# Patient Record
Sex: Female | Born: 1975 | Race: White | Hispanic: No | Marital: Married | Smoking: Never smoker
Health system: Southern US, Community
[De-identification: ages and names within clinical notes are randomized; demographics above are authoritative.]

## PROBLEM LIST (undated history)

## (undated) DIAGNOSIS — E119 Type 2 diabetes mellitus without complications: Secondary | ICD-10-CM

## (undated) HISTORY — PX: KNEE SURGERY: SHX244

---

## 2014-07-04 ENCOUNTER — Ambulatory Visit: Payer: Self-pay | Admitting: Orthopedic Surgery

## 2015-04-19 NOTE — Op Note (Signed)
PATIENT NAME:  Kimberly Blair, Kimberly Blair MR#:  [id number] DATE OF BIRTH:  1976-02-20  DATE OF PROCEDURE:  07/04/2014  PREOPERATIVE DIAGNOSIS: Medial meniscus tear and osteoarthritis, right knee.   POSTOPERATIVE DIAGNOSIS:  Medial meniscus tear and osteoarthritis, right knee.   PROCEDURE: Partial medial meniscectomy and medial femoral chondroplasty, right knee.   ANESTHESIA: General.   SURGEON: Kennedy BuckerMichael Samaiya Awadallah, MD   DESCRIPTION OF PROCEDURE: The patient was brought to the operating room and after adequate anesthesia was obtained, the right leg was prepped and draped in the usual sterile fashion with a tourniquet applied to the upper thigh along with the arthroscopic legholder. After patient identification and timeout procedures were completed, an inferolateral portal was made and the arthroscope was introduced. Initial inspection of the knee revealed some central articular cartilage loss on the patella with some fibrillation of the trochlear cartilage. The patella appeared to track well. The suprapatellar pouch was essentially normal except for some small bits of loose articular cartilage floating around within the knee, nothing large enough to count as a loose body. Coming around medially, there was no plica band. An inferomedial portal was made and a probe introduced. There was an anterior detached tear of the medial meniscus that flipped up into the notch and also could be brought into the joint as significant loose fragment. This fragment of meniscus was subsequently excised with the use of an ArthroCare wand. With regard to the medial femoral condyle, there was fissuring and loose flaps of articular cartilage over approximately 50% of the weight bearing surface with the knee in extension and mid flexion. There were some small areas of exposed bone. The ACL was intact and lateral compartment was essentially normal. The gutters were free of any loose bodies. The ArthroCare wand was used to address the anterior horn  medial meniscus tear and a Paragon wand was then used to smooth the articular surface and remove these loose flaps of articular cartilage. Following this, the knee was thoroughly irrigated until clear. All instrumentation was withdrawn. The wound was closed with simple interrupted 4-0 nylon, 20 mL 0.5% Sensorcaine infiltrated for postoperative analgesia. Xeroform, 4 x 4's, Webril and Ace wrap applied. The patient was sent to the recovery room in stable condition.   ESTIMATED BLOOD LOSS: Minimal.   COMPLICATIONS: None.   SPECIMEN: None.    ____________________________ Leitha SchullerMichael J. Marta Bouie, MD mjm:dd D: 07/04/2014 22:16:10 ET T: 07/05/2014 03:04:38 ET JOB#: [id number]  cc: Leitha SchullerMichael J. Elleanor Guyett, MD, <Dictator> Leitha SchullerMICHAEL J Freman Lapage MD ELECTRONICALLY SIGNED 07/05/2014 7:51

## 2018-07-24 ENCOUNTER — Emergency Department
Admission: EM | Admit: 2018-07-24 | Discharge: 2018-07-24 | Disposition: A | Discharge status: Home / Self Care | Payer: Self-pay | Attending: Emergency Medicine | Admitting: Emergency Medicine

## 2018-07-24 ENCOUNTER — Encounter: Payer: Self-pay | Admitting: Emergency Medicine

## 2018-07-24 ENCOUNTER — Other Ambulatory Visit: Payer: Self-pay

## 2018-07-24 ENCOUNTER — Emergency Department: Payer: Self-pay

## 2018-07-24 DIAGNOSIS — R739 Hyperglycemia, unspecified: Secondary | ICD-10-CM

## 2018-07-24 DIAGNOSIS — N12 Tubulo-interstitial nephritis, not specified as acute or chronic: Secondary | ICD-10-CM

## 2018-07-24 DIAGNOSIS — N10 Acute tubulo-interstitial nephritis: Secondary | ICD-10-CM | POA: Insufficient documentation

## 2018-07-24 DIAGNOSIS — R109 Unspecified abdominal pain: Secondary | ICD-10-CM

## 2018-07-24 DIAGNOSIS — R509 Fever, unspecified: Secondary | ICD-10-CM

## 2018-07-24 DIAGNOSIS — Z79899 Other long term (current) drug therapy: Secondary | ICD-10-CM | POA: Insufficient documentation

## 2018-07-24 DIAGNOSIS — E1165 Type 2 diabetes mellitus with hyperglycemia: Secondary | ICD-10-CM | POA: Insufficient documentation

## 2018-07-24 HISTORY — DX: Type 2 diabetes mellitus without complications: E11.9

## 2018-07-24 LAB — COMPREHENSIVE METABOLIC PANEL
ALK PHOS: 105 U/L (ref 38–126)
ALT: 67 U/L — AB (ref 0–44)
AST: 88 U/L — ABNORMAL HIGH (ref 15–41)
Albumin: 3.3 g/dL — ABNORMAL LOW (ref 3.5–5.0)
Anion gap: 13 (ref 5–15)
BUN: 19 mg/dL (ref 6–20)
CALCIUM: 8.4 mg/dL — AB (ref 8.9–10.3)
CHLORIDE: 93 mmol/L — AB (ref 98–111)
CO2: 22 mmol/L (ref 22–32)
Creatinine, Ser: 1.07 mg/dL — ABNORMAL HIGH (ref 0.44–1.00)
GFR calc Af Amer: >60 mL/min (ref >60)
GFR calc non Af Amer: >60 mL/min (ref >60)
GLUCOSE: 394 mg/dL — AB (ref 70–99)
Potassium: 4.4 mmol/L (ref 3.5–5.1)
Sodium: 128 mmol/L — ABNORMAL LOW (ref 135–145)
Total Bilirubin: 0.5 mg/dL (ref 0.3–1.2)
Total Protein: 7.2 g/dL (ref 6.5–8.1)

## 2018-07-24 LAB — GLUCOSE, CAPILLARY: GLUCOSE-CAPILLARY: 273 mg/dL — AB (ref 70–99)

## 2018-07-24 LAB — URINALYSIS, COMPLETE (UACMP) WITH MICROSCOPIC
BILIRUBIN URINE: NEGATIVE
Glucose, UA: >=500 mg/dL — AB
Ketones, ur: 5 mg/dL — AB
Nitrite: POSITIVE — AB
Protein, ur: 30 mg/dL — AB
Specific Gravity, Urine: 1.013 (ref 1.005–1.030)
WBC, UA: >50 WBC/hpf — ABNORMAL HIGH (ref 0–5)
pH: 6 (ref 5.0–8.0)

## 2018-07-24 LAB — CBC WITH DIFFERENTIAL/PLATELET
Basophils Absolute: 0 10*3/uL (ref 0–0.1)
Basophils Relative: 0 %
EOS ABS: 0 10*3/uL (ref 0–0.7)
Eosinophils Relative: 0 %
HEMATOCRIT: 34.5 % — AB (ref 35.0–47.0)
Hemoglobin: 12 g/dL (ref 12.0–16.0)
LYMPHS ABS: 0.5 10*3/uL — AB (ref 1.0–3.6)
LYMPHS PCT: 7 %
MCH: 30.9 pg (ref 26.0–34.0)
MCHC: 34.7 g/dL (ref 32.0–36.0)
MCV: 88.8 fL (ref 80.0–100.0)
MONOS PCT: 6 %
Monocytes Absolute: 0.4 10*3/uL (ref 0.2–0.9)
Neutro Abs: 6.3 10*3/uL (ref 1.4–6.5)
Neutrophils Relative %: 87 %
Platelets: 251 10*3/uL (ref 150–440)
RBC: 3.88 MIL/uL (ref 3.80–5.20)
RDW: 12.2 % (ref 11.5–14.5)
WBC: 7.3 10*3/uL (ref 3.6–11.0)

## 2018-07-24 LAB — LACTIC ACID, PLASMA: Lactic Acid, Venous: 1.7 mmol/L (ref 0.5–1.9)

## 2018-07-24 LAB — POCT PREGNANCY, URINE: PREG TEST UR: NEGATIVE

## 2018-07-24 LAB — LIPASE, BLOOD: LIPASE: 19 U/L (ref 11–51)

## 2018-07-24 MED ORDER — SODIUM CHLORIDE 0.9 % IV BOLUS
1000.0000 mL | Freq: Once | INTRAVENOUS | Status: AC
  Administered 2018-07-24: 1000 mL via INTRAVENOUS

## 2018-07-24 MED ORDER — ACETAMINOPHEN 500 MG PO TABS
1000.0000 mg | ORAL_TABLET | Freq: Once | ORAL | Status: AC
  Administered 2018-07-24: 1000 mg via ORAL
  Filled 2018-07-24: qty 2

## 2018-07-24 MED ORDER — ONDANSETRON 4 MG PO TBDP: 4.0000 mg | ORAL_TABLET | Freq: Three times a day (TID) | ORAL | 0 refills | Status: AC | PRN

## 2018-07-24 MED ORDER — SODIUM CHLORIDE 0.9 % IV SOLN
2.0000 g | Freq: Once | INTRAVENOUS | Status: AC
  Administered 2018-07-24: 2 g via INTRAVENOUS
  Filled 2018-07-24: qty 20

## 2018-07-24 MED ORDER — CEPHALEXIN 500 MG PO CAPS: 500.0000 mg | ORAL_CAPSULE | Freq: Three times a day (TID) | ORAL | 0 refills | Status: AC

## 2018-07-24 NOTE — ED Triage Notes (Signed)
Pt reports lower left back pain since Thursday with difficulty voiding; pt says she has pressure at the end of her stream; now with lower left quadrant pain and feeling hot; pt febrile in triage,100.8; temp never checked at home; ibuprofen taken at 4pm and Alleve at 7pm; pt is diabetic; blood sugar at home 358 at 1am; pt took 12 units Levemir;

## 2018-07-24 NOTE — ED Provider Notes (Signed)
[facility] Emergency Department Provider Note   ____________________________________________   First MD Initiated Contact with Patient 07/24/18 [phone]     (approximate)  I have reviewed the triage vital signs and the nursing notes.   HISTORY  Chief Complaint Code Sepsis    HPI Kimberly Blair is a 42 y.o. female who presents to the ED from home with a chief complaint of left flank pain and urinary problems.  Patient reports 1 week ago she was experiencing dysuria.  Started to drink cranberry juice.  Complains of left flank pain x4 days with difficulty voiding.  Reports urgency and hesitancy.  Today with left lower quadrant abdominal pain and having chills.  Took ibuprofen at 4 PM and Aleve at 7 PM.  Patient is an insulin-dependent diabetic and has noticed her blood sugars at home in the 300 range; baseline 120s.  Denies associated headache, neck pain, chest pain, shortness of breath, cough, nausea, vomiting, diarrhea.  Denies recent travel or trauma.   Past Medical History:  Diagnosis Date  . Diabetes (HCC)     There are no active problems to display for this patient.   Past Surgical History:  Procedure Laterality Date  . KNEE SURGERY Right     Prior to Admission medications   Medication Sig Start Date End Date Taking? Authorizing Provider  glipiZIDE (GLUCOTROL) 5 MG tablet Take by mouth 2 (two) times daily before a meal.   Yes [provider]  insulin detemir (LEVEMIR) 100 UNIT/ML injection Inject into the skin as needed.   Yes [provider]  lisinopril (PRINIVIL,ZESTRIL) 10 MG tablet Take 10 mg by mouth daily.   Yes [provider]  metFORMIN (GLUCOPHAGE) 1000 MG tablet Take 1,000 mg by mouth 2 (two) times daily with a meal.   Yes [provider]  cephALEXin (KEFLEX) 500 MG capsule Take 1 capsule (500 mg total) by mouth 3 (three) times daily. 07/24/18   Irean Hong, MD  ondansetron (ZOFRAN ODT) 4 MG  disintegrating tablet Take 1 tablet (4 mg total) by mouth every 8 (eight) hours as needed for nausea or vomiting. 07/24/18   Irean Hong, MD    Allergies Codeine  History reviewed. No pertinent family history.  Social History Social History   Tobacco Use  . Smoking status: Never Smoker  . Smokeless tobacco: Never Used  Substance Use Topics  . Alcohol use: Yes  . Drug use: Never    Review of Systems  Constitutional: Positive for fever/chills Eyes: No visual changes. ENT: No sore throat. Cardiovascular: Denies chest pain. Respiratory: Denies shortness of breath. Gastrointestinal: Positive for left flank and left lower quadrant abdominal pain.  No nausea, no vomiting.  No diarrhea.  No constipation. Genitourinary: Positive for dysuria. Musculoskeletal: Negative for back pain. Skin: Negative for rash. Neurological: Negative for headaches, focal weakness or numbness.   ____________________________________________   PHYSICAL EXAM:  VITAL SIGNS: ED Triage Vitals  Enc Vitals Group     BP 07/24/18 0238 (!) 105/45     Pulse Rate 07/24/18 0238 (!) 146     Resp 07/24/18 0238 17     Temp 07/24/18 0238 (!) 100.8 F (38.2 C)     Temp Source 07/24/18 0238 Oral     SpO2 07/24/18 0238 97 %     Weight 07/24/18 0239 146 lb (66.2 kg)     Height 07/24/18 0239 5\' 4"  (1.626 m)     Head Circumference --      Peak Flow --  Pain Score 07/24/18 0239 1     Pain Loc --      Pain Edu? --      Excl. in GC? --     Constitutional: Alert and oriented.  Anxious appearing and in mild acute distress. Eyes: Conjunctivae are normal. PERRL. EOMI. Head: Atraumatic. Nose: No congestion/rhinnorhea. Mouth/Throat: Mucous membranes are moist.  Oropharynx non-erythematous. Neck: No stridor.   Cardiovascular: Tachycardic rate, regular rhythm. Grossly normal heart sounds.  Good peripheral circulation. Respiratory: Normal respiratory effort.  No retractions. Lungs CTAB. Gastrointestinal: Soft and  mildly tender to palpation left lower quadrant without rebound or guarding. No distention. No abdominal bruits.  Mild left CVA tenderness. Musculoskeletal: No lower extremity tenderness nor edema.  No joint effusions. Neurologic:  Normal speech and language. No gross focal neurologic deficits are appreciated. No gait instability. Skin:  Skin is warm, dry and intact. No rash noted. Psychiatric: Mood and affect are normal. Speech and behavior are normal.  ____________________________________________   LABS (all labs ordered are listed, but only abnormal results are displayed)  Labs Reviewed  COMPREHENSIVE METABOLIC PANEL - Abnormal; Notable for the following components:      Result Value   Sodium 128 (*)    Chloride 93 (*)    Glucose, Bld 394 (*)    Creatinine, Ser 1.07 (*)    Calcium 8.4 (*)    Albumin 3.3 (*)    AST 88 (*)    ALT 67 (*)    All other components within normal limits  CBC WITH DIFFERENTIAL/PLATELET - Abnormal; Notable for the following components:   HCT 34.5 (*)    Lymphs Abs 0.5 (*)    All other components within normal limits  URINALYSIS, COMPLETE (UACMP) WITH MICROSCOPIC - Abnormal; Notable for the following components:   Color, Urine YELLOW (*)    APPearance HAZY (*)    Glucose, UA >=500 (*)    Hgb urine dipstick MODERATE (*)    Ketones, ur 5 (*)    Protein, ur 30 (*)    Nitrite POSITIVE (*)    Leukocytes, UA SMALL (*)    WBC, UA >50 (*)    Bacteria, UA RARE (*)    Non Squamous Epithelial 0-5 (*)    All other components within normal limits  GLUCOSE, CAPILLARY - Abnormal; Notable for the following components:   Glucose-Capillary 273 (*)    All other components within normal limits  CULTURE, BLOOD (ROUTINE X 2)  CULTURE, BLOOD (ROUTINE X 2)  LACTIC ACID, PLASMA  LIPASE, BLOOD  POC URINE PREG, ED  POCT PREGNANCY, URINE   ____________________________________________  EKG  None ____________________________________________  RADIOLOGY  ED MD  interpretation: No stones, pyelonephritis, liver density, pancreatic head haziness  Official radiology report(s): Ct Renal Stone Study  Result Date: 07/24/2018 CLINICAL DATA:  Left flank pain and fever. EXAM: CT ABDOMEN AND PELVIS WITHOUT CONTRAST TECHNIQUE: Multidetector CT imaging of the abdomen and pelvis was performed following the standard protocol without IV contrast. COMPARISON:  None. FINDINGS: Lower chest: Minimal hypoventilatory atelectasis. Hepatobiliary: Questionable peripheral low-density lesion in the right lobe of the liver, incompletely characterized without contrast. Gallbladder physiologically distended, no calcified stone. No biliary dilatation. Pancreas: Pancreas is not well-defined, without definite pancreatic tissue visualized. There is soft tissue edema in the expected location of the pancreatic head with questionable minimal pancreatic tissue. Spleen: Normal in size without focal abnormality. Adrenals/Urinary Tract: No adrenal nodule. Moderate left perinephric edema with prominence of the ureter and renal collecting system. No obstructing stone.  No right hydronephrosis or perinephric edema. Urinary bladder is partially distended, equivocal mild wall thickening about the dome. Stomach/Bowel: Stomach is within normal limits. Appendix appears normal. No evidence of bowel wall thickening, distention, or inflammatory changes. Vascular/Lymphatic: Normal caliber abdominal aorta. Minimal bi-iliac atherosclerosis. Small retroperitoneal nodes. No enlarged abdominal or pelvic lymph nodes allowing for limitations secondary to lack of IV contrast. Reproductive: Retroverted uterus which appears slightly bulbous, possible underlying fibroids. No evidence of adnexal mass. Other: No free air, free fluid, or intra-abdominal fluid collection. Musculoskeletal: There are no acute or suspicious osseous abnormalities. IMPRESSION: 1. Left perinephric edema without urolithiasis, suspicious for pyelonephritis.  Equivocal bladder wall thickening which may represent cystitis. 2. Questionable low-density lesion in the right lobe of the liver, incompletely assessed on noncontrast exam. Recommend hepatic protocol MRI on a nonemergent basis for characterization. 3. Pancreatic tissue is not well visualized, however there is edema in the region of the pancreatic head. Findings raise concern for pancreatitis, recommend correlation with pancreatic enzymes. Pancreatic parenchyma can also be assessed on hepatic MRI. Electronically Signed   By: Rubye Oaks M.D.   On: 07/24/2018 05:22    ____________________________________________   PROCEDURES  Procedure(s) performed: None  Procedures  Critical Care performed: Yes, see critical care note(s)   CRITICAL CARE Performed by: Irean Hong   Total critical care time: 45 minutes  Critical care time was exclusive of separately billable procedures and treating other patients.  Critical care was necessary to treat or prevent imminent or life-threatening deterioration.  Critical care was time spent personally by me on the following activities: development of treatment plan with patient and/or surrogate as well as nursing, discussions with consultants, evaluation of patient's response to treatment, examination of patient, obtaining history from patient or surrogate, ordering and performing treatments and interventions, ordering and review of laboratory studies, ordering and review of radiographic studies, pulse oximetry and re-evaluation of patient's condition.  ____________________________________________   INITIAL IMPRESSION / ASSESSMENT AND PLAN / ED COURSE  As part of my medical decision making, I reviewed the following data within the electronic MEDICAL RECORD NUMBER Nursing notes reviewed and incorporated, Labs reviewed, Old chart reviewed, Radiograph reviewed  and Notes from prior ED visits   42 year old female who presents with left flank, left lower  quadrant abdominal pain and difficulty voiding. Differential diagnosis includes, but is not limited to, ovarian cyst, ovarian torsion, acute appendicitis, diverticulitis, urinary tract infection/pyelonephritis, endometriosis, bowel obstruction, colitis, renal colic, gastroenteritis, hernia, fibroids, endometriosis, pregnancy related pain including ectopic pregnancy, etc.  Will check blood cultures, lactate, basic lab work.  Initiate IV fluid resuscitation.  Tylenol for fever.  Will obtain CT renal colic protocol and reassess.  Clinical Course as of Jul 24 732  Mon Jul 24, 2018  0407 Heart rate down to 111.  Patient resting in no acute distress, awaiting CT scan.  Updated her of lab results.  Will administer second liter IV fluid and recheck blood sugar.   [JS]  [id number] Patient resting in no acute distress, awaiting results of CT scan.  2 L IV fluids infused.  Patient will try to provide urine specimen.  Will recheck blood sugar at this time.  Blood pressure 86/61 without altered mentation.  Temperature down to 98.9 F.   [JS]  [id number] Updated patient on CT and urinalysis results.  Lipase is normal.  Preliminary blood cultures negative.  Will hang third liter IV normal saline.  2 g IV Rocephin for pyelonephritis and reassess.  Repeat blood sugar after 2  L normal saline is 273.   [JS]  0733 IV antibiotics finished.  Patient is feeling significantly better.  Will discharge home on Keflex.  ODT Zofran as needed for nausea.  She will follow-up closely with her PCP for recheck as well as to discuss hepatic MRI.  Strict return precautions given.  Patient verbalizes understanding agrees with plan of care.   [JS]    Clinical Course User Index [JS] Irean Hong, MD     ____________________________________________   FINAL CLINICAL IMPRESSION(S) / ED DIAGNOSES  Final diagnoses:  Fever, unspecified fever cause  Acute left flank pain  Pyelonephritis  Hyperglycemia     ED Discharge Orders         Ordered    cephALEXin (KEFLEX) 500 MG capsule  3 times daily     07/24/18 0634    ondansetron (ZOFRAN ODT) 4 MG disintegrating tablet  Every 8 hours PRN     07/24/18 6213       Note:  This document was prepared using Dragon voice recognition software and may include unintentional dictation errors.    Irean Hong, MD 07/24/18 [phone]

## 2018-07-24 NOTE — Discharge Instructions (Signed)
1.  Alternate Tylenol and ibuprofen every 4 hours as needed for fever greater than 100.4 F. 2.  Take antibiotic as prescribed (Keflex 500 mg 3 times daily x7 days). 3.  You may take Zofran as needed for nausea. 4.  There may be a lesion on your liver but this was not clearly seen on your CT scan.  Please speak with your doctor about getting a liver MRI to further evaluate this. 5.  Return to the ER for worsening symptoms, persistent vomiting, difficulty breathing, or other concerns.

## 2018-07-24 NOTE — ED Notes (Signed)
ED Provider at bedside. 

## 2018-07-24 NOTE — ED Notes (Signed)
MD informed of patient's BP 

## 2018-07-29 LAB — CULTURE, BLOOD (ROUTINE X 2)
CULTURE: NO GROWTH
Culture: NO GROWTH
Special Requests: ADEQUATE

## 2019-06-24 IMAGING — CT CT RENAL STONE PROTOCOL
3 of 4 series · 9 of 46 positions shown, 14 images · non-contrast
Comparison: None.

CLINICAL DATA: Left flank pain and fever.

EXAM:
CT ABDOMEN AND PELVIS WITHOUT CONTRAST
TECHNIQUE: Multidetector CT imaging of the abdomen and pelvis was performed
following the standard protocol without IV contrast.

[Series 4: lung bases · axial · 0.64mm/px · z∈[-828,-743]mm · 5 of 27 slices shown, 10 images]
[im 5/27  soft-tissue]
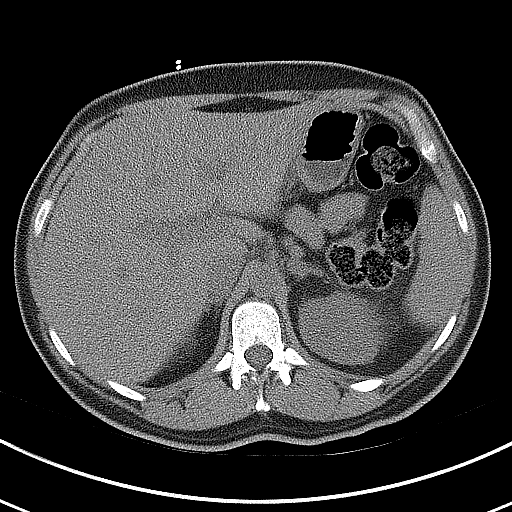
[im 5/27  bone]
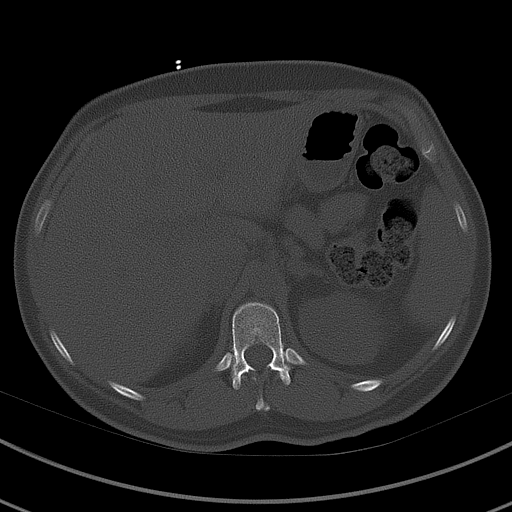
[im 9/27  soft-tissue]
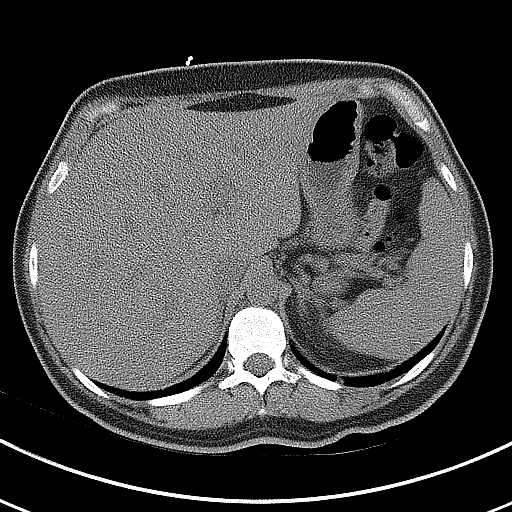
[im 9/27  lung]
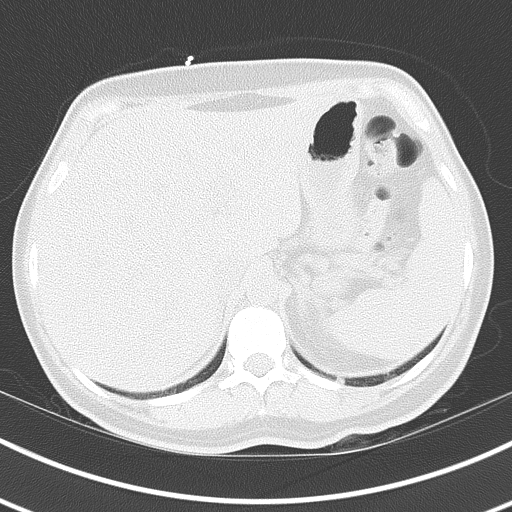
[im 14/27  soft-tissue]
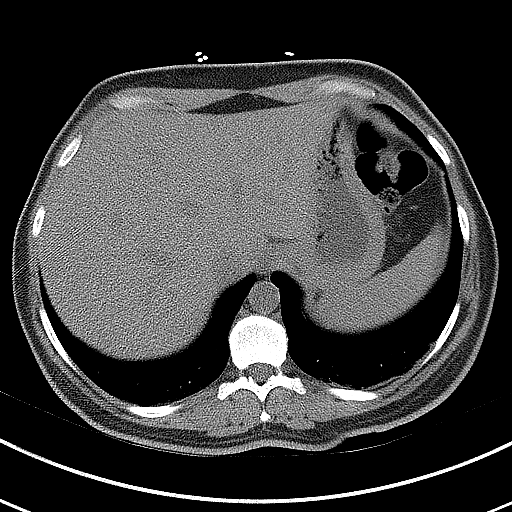
[im 14/27  lung]
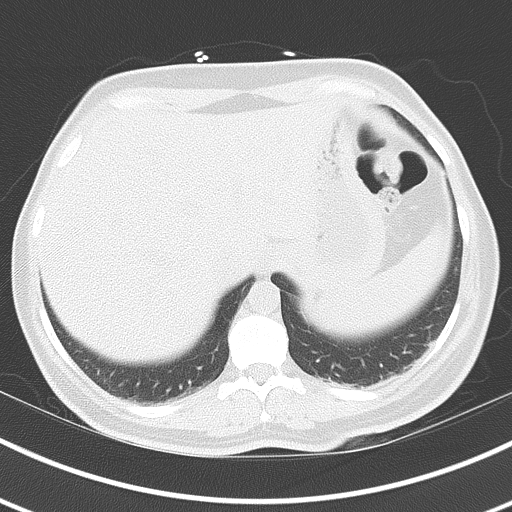
[im 18/27  soft-tissue]
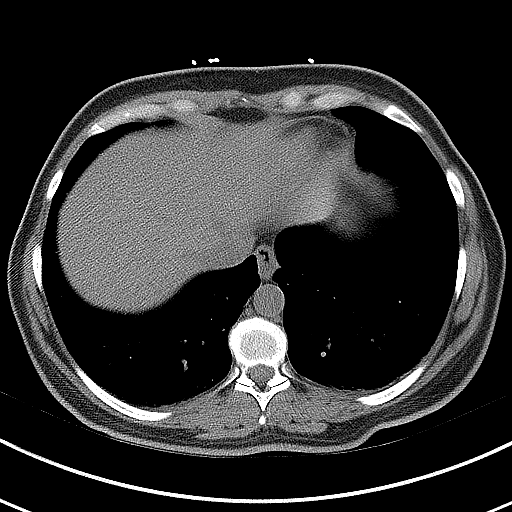
[im 18/27  lung]
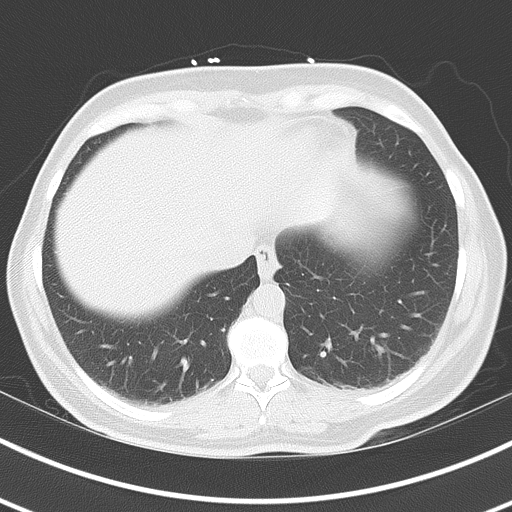
[im 22/27  soft-tissue]
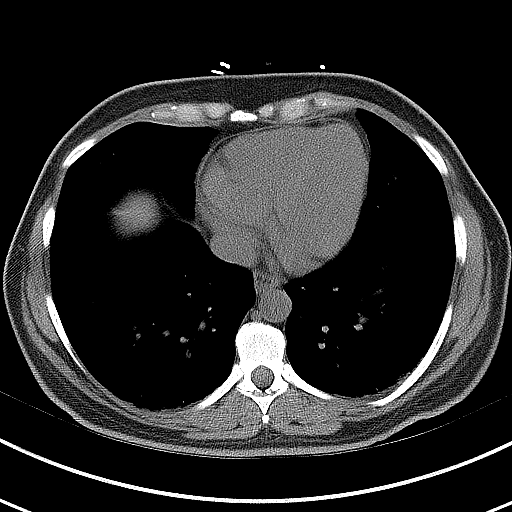
[im 22/27  lung]
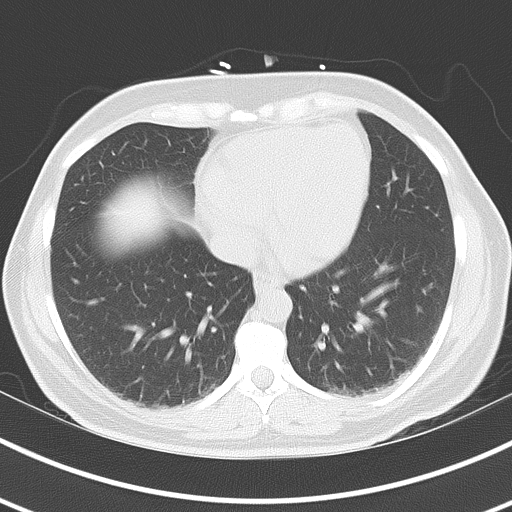

[Series 5: coronal · coronal · 0.66mm/px · 3 of 123 slices shown]
[im 41/123  soft-tissue]
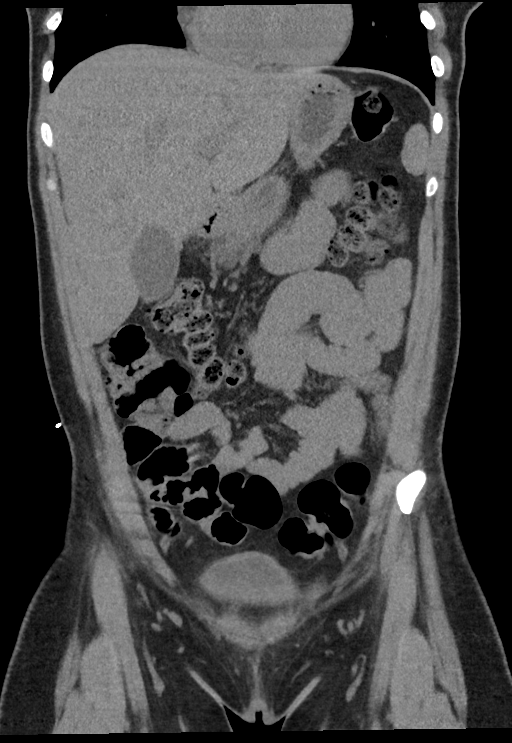
[im 55/123  soft-tissue]
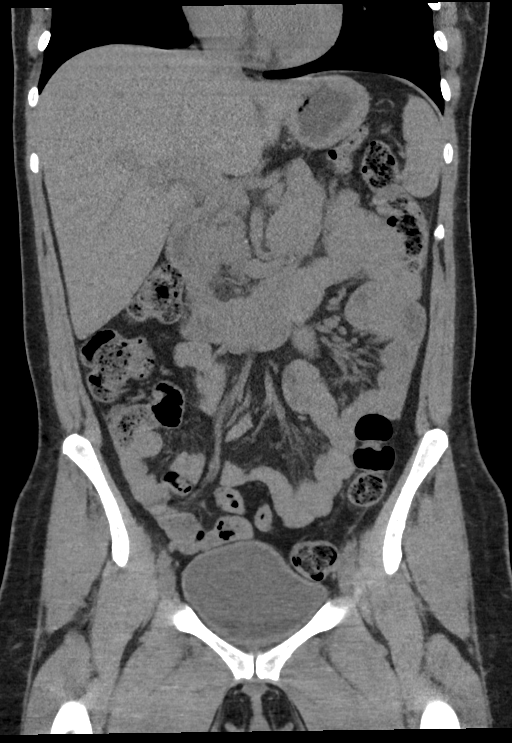
[im 68/123  soft-tissue]
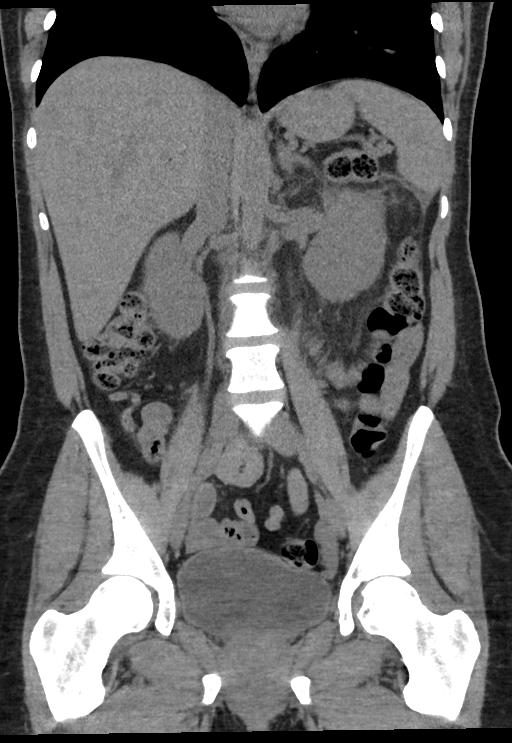

[Series 6: sagittal · sagittal · 0.48mm/px · 1 of 170 slices shown]
[im 57/170  soft-tissue]
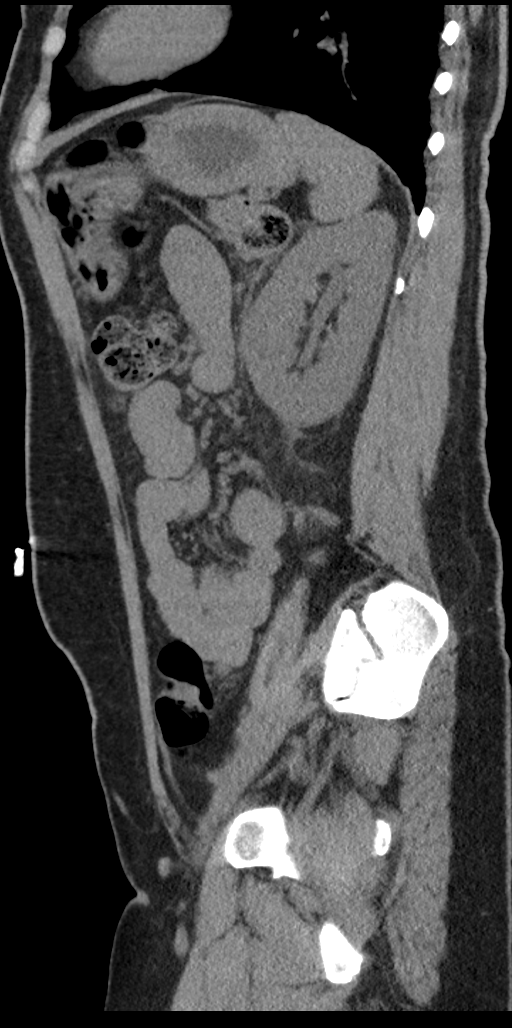

[9 of 46 positions shown; findings below may reference images not displayed]

FINDINGS: Lower chest: Minimal hypoventilatory atelectasis.

Hepatobiliary: Questionable peripheral low-density lesion in the
right lobe of the liver, incompletely characterized without
contrast. Gallbladder physiologically distended, no calcified stone.
No biliary dilatation.

Pancreas: Pancreas is not well-defined, without definite pancreatic
tissue visualized. There is soft tissue edema in the expected
location of the pancreatic head with questionable minimal pancreatic
tissue.

Spleen: Normal in size without focal abnormality.

Adrenals/Urinary Tract: No adrenal nodule. Moderate left perinephric
edema with prominence of the ureter and renal collecting system. No
obstructing stone. No right hydronephrosis or perinephric edema.
Urinary bladder is partially distended, equivocal mild wall
thickening about the dome.

Stomach/Bowel: Stomach is within normal limits. Appendix appears
normal. No evidence of bowel wall thickening, distention, or
inflammatory changes.

Vascular/Lymphatic: Normal caliber abdominal aorta. Minimal bi-iliac
atherosclerosis. Small retroperitoneal nodes. No enlarged abdominal
or pelvic lymph nodes allowing for limitations secondary to lack of
IV contrast.

Reproductive: Retroverted uterus which appears slightly bulbous,
possible underlying fibroids. No evidence of adnexal mass.

Other: No free air, free fluid, or intra-abdominal fluid collection.

Musculoskeletal: There are no acute or suspicious osseous
abnormalities.
IMPRESSION: 1. Left perinephric edema without urolithiasis, suspicious for
pyelonephritis. Equivocal bladder wall thickening which may
represent cystitis.
2. Questionable low-density lesion in the right lobe of the liver,
incompletely assessed on noncontrast exam. Recommend hepatic
protocol MRI on a nonemergent basis for characterization.
3. Pancreatic tissue is not well visualized, however there is edema
in the region of the pancreatic head. Findings raise concern for
pancreatitis, recommend correlation with pancreatic enzymes.
Pancreatic parenchyma can also be assessed on hepatic MRI.

## 2021-03-12 ENCOUNTER — Telehealth: Payer: Self-pay | Admitting: Family Medicine

## 2021-03-12 NOTE — Telephone Encounter (Signed)
I'm not sure if we keep a waitlist, but if not, can call back in 3-6 months to see if I am accepting new patients again at that time. Unable to currently given providers leaving in our office.  Copied from CRM [id number]. Topic: Appointment Scheduling - Scheduling Inquiry for Clinic >> Mar 11, 2021 12:14 PM Elliot Gault wrote: Reason for TXM:IWOEHOZ would like to establish care with Dr. Beryle Flock as a new patient. Patient is aware PCP is not accepting but wanted to be place on a wait list and to check if PCP would approve.

## 2021-03-17 NOTE — Telephone Encounter (Signed)
Left VM letting her know that we are not currently accepting new pt's and to call back to see if Dr. B is in 3-6 months.

## 2022-03-30 ENCOUNTER — Other Ambulatory Visit: Payer: Self-pay | Admitting: Family Medicine

## 2022-03-30 DIAGNOSIS — M25512 Pain in left shoulder: Secondary | ICD-10-CM

## 2022-03-30 DIAGNOSIS — W19XXXS Unspecified fall, sequela: Secondary | ICD-10-CM

## 2022-03-30 DIAGNOSIS — M24812 Other specific joint derangements of left shoulder, not elsewhere classified: Secondary | ICD-10-CM

## 2022-03-31 ENCOUNTER — Ambulatory Visit: Payer: Self-pay

## 2022-03-31 ENCOUNTER — Other Ambulatory Visit: Payer: Self-pay | Admitting: Family Medicine

## 2022-03-31 DIAGNOSIS — M25512 Pain in left shoulder: Secondary | ICD-10-CM

## 2022-03-31 DIAGNOSIS — M24812 Other specific joint derangements of left shoulder, not elsewhere classified: Secondary | ICD-10-CM

## 2022-03-31 DIAGNOSIS — W19XXXS Unspecified fall, sequela: Secondary | ICD-10-CM

## 2022-04-06 ENCOUNTER — Ambulatory Visit
Admission: RE | Admit: 2022-04-06 | Discharge: 2022-04-06 | Disposition: A | Discharge status: Home / Self Care | Payer: BC Managed Care – PPO | Source: Ambulatory Visit | Attending: Family Medicine | Admitting: Family Medicine

## 2022-04-06 DIAGNOSIS — M25512 Pain in left shoulder: Secondary | ICD-10-CM | POA: Insufficient documentation

## 2022-04-06 DIAGNOSIS — W19XXXS Unspecified fall, sequela: Secondary | ICD-10-CM | POA: Insufficient documentation

## 2022-04-06 DIAGNOSIS — M24812 Other specific joint derangements of left shoulder, not elsewhere classified: Secondary | ICD-10-CM

## 2022-04-06 MED ORDER — IOHEXOL 180 MG/ML  SOLN
20.0000 mL | Freq: Once | INTRAMUSCULAR | Status: AC | PRN
  Administered 2022-04-06: 13 mL

## 2022-04-06 MED ORDER — SODIUM CHLORIDE (PF) 0.9 % IJ SOLN
10.0000 mL | INTRAMUSCULAR | Status: AC | PRN
  Administered 2022-04-06: 7 mL

## 2022-04-06 MED ORDER — LIDOCAINE HCL (PF) 1 % IJ SOLN
5.0000 mL | Freq: Once | INTRAMUSCULAR | Status: AC
  Administered 2022-04-06: 5 mL

## 2022-04-06 MED ORDER — GADOBUTROL 1 MMOL/ML IV SOLN
1.0000 mL | Freq: Once | INTRAVENOUS | Status: AC | PRN
  Administered 2022-04-06: 0.1 mL

## 2022-12-29 ENCOUNTER — Other Ambulatory Visit: Payer: Self-pay

## 2022-12-29 ENCOUNTER — Telehealth: Payer: Self-pay

## 2022-12-29 DIAGNOSIS — R195 Other fecal abnormalities: Secondary | ICD-10-CM

## 2022-12-29 DIAGNOSIS — Z1211 Encounter for screening for malignant neoplasm of colon: Secondary | ICD-10-CM

## 2022-12-29 MED ORDER — NA SULFATE-K SULFATE-MG SULF 17.5-3.13-1.6 GM/177ML PO SOLN: 1.0000 | Freq: Once | ORAL | 0 refills | Status: AC

## 2022-12-29 NOTE — Telephone Encounter (Signed)
Gastroenterology Pre-Procedure Review  Request Date: 03/04/23 Requesting Physician: Dr. Marius Ditch  PATIENT REVIEW QUESTIONS: The patient responded to the following health history questions as indicated:    1. Are you having any GI issues? no 2. Do you have a personal history of Polyps? no 3. Do you have a family history of Colon Cancer or Polyps? no 4. Diabetes Mellitus?  5. Joint replacements in the past 12 months?no 6. Major health problems in the past 3 months?no 7. Any artificial heart valves, MVP, or defibrillator?no    MEDICATIONS & ALLERGIES:    Patient reports the following regarding taking any anticoagulation/antiplatelet therapy:   Plavix, Coumadin, Eliquis, Xarelto, Lovenox, Pradaxa, Brilinta, or Effient? no Aspirin? no  Patient confirms/reports the following medications:  Current Outpatient Medications  Medication Sig Dispense Refill   cephALEXin (KEFLEX) 500 MG capsule Take 1 capsule (500 mg total) by mouth 3 (three) times daily. 21 capsule 0   glipiZIDE (GLUCOTROL) 5 MG tablet Take by mouth 2 (two) times daily before a meal.     insulin detemir (LEVEMIR) 100 UNIT/ML injection Inject into the skin as needed.     lisinopril (PRINIVIL,ZESTRIL) 10 MG tablet Take 10 mg by mouth daily.     metFORMIN (GLUCOPHAGE) 1000 MG tablet Take 1,000 mg by mouth 2 (two) times daily with a meal.     ondansetron (ZOFRAN ODT) 4 MG disintegrating tablet Take 1 tablet (4 mg total) by mouth every 8 (eight) hours as needed for nausea or vomiting. 20 tablet 0   No current facility-administered medications for this visit.    Patient confirms/reports the following allergies:  Allergies  Allergen Reactions   Codeine Hives    No orders of the defined types were placed in this encounter.   AUTHORIZATION INFORMATION Primary Insurance: 1D#: Group #:  Secondary Insurance: 1D#: Group #:  SCHEDULE INFORMATION: Date: 03/04/23 Time: Location: [facility]

## 2023-03-04 ENCOUNTER — Ambulatory Visit
Admission: RE | Admit: 2023-03-04 | Discharge: 2023-03-04 | Disposition: A | Discharge status: Home / Self Care | Payer: BC Managed Care – PPO | Attending: Gastroenterology | Admitting: Gastroenterology

## 2023-03-04 ENCOUNTER — Ambulatory Visit: Payer: BC Managed Care – PPO | Admitting: Certified Registered Nurse Anesthetist

## 2023-03-04 ENCOUNTER — Encounter
Admission: RE | Disposition: A | Discharge status: Home / Self Care | Payer: Self-pay | Source: Home / Self Care | Attending: Gastroenterology

## 2023-03-04 DIAGNOSIS — R195 Other fecal abnormalities: Secondary | ICD-10-CM | POA: Insufficient documentation

## 2023-03-04 DIAGNOSIS — Z7984 Long term (current) use of oral hypoglycemic drugs: Secondary | ICD-10-CM | POA: Diagnosis not present

## 2023-03-04 DIAGNOSIS — E119 Type 2 diabetes mellitus without complications: Secondary | ICD-10-CM | POA: Diagnosis not present

## 2023-03-04 DIAGNOSIS — Z1211 Encounter for screening for malignant neoplasm of colon: Secondary | ICD-10-CM

## 2023-03-04 HISTORY — PX: COLONOSCOPY WITH PROPOFOL: SHX5780

## 2023-03-04 LAB — POCT PREGNANCY, URINE: Preg Test, Ur: NEGATIVE

## 2023-03-04 SURGERY — COLONOSCOPY WITH PROPOFOL
Anesthesia: General

## 2023-03-04 MED ORDER — LIDOCAINE HCL (CARDIAC) PF 100 MG/5ML IV SOSY
PREFILLED_SYRINGE | INTRAVENOUS | Status: DC | PRN
  Administered 2023-03-04: 50 mg via INTRAVENOUS

## 2023-03-04 MED ORDER — SODIUM CHLORIDE 0.9 % IV SOLN
INTRAVENOUS | Status: DC
  Administered 2023-03-04: 20 mL/h via INTRAVENOUS

## 2023-03-04 MED ORDER — PROPOFOL 10 MG/ML IV BOLUS
INTRAVENOUS | Status: DC | PRN
Start: 2023-03-04 — End: 2023-03-04
  Administered 2023-03-04 (×2): 50 mg via INTRAVENOUS

## 2023-03-04 MED ORDER — PROPOFOL 500 MG/50ML IV EMUL
INTRAVENOUS | Status: DC | PRN
  Administered 2023-03-04: 140 ug/kg/min via INTRAVENOUS

## 2023-03-04 MED ORDER — PROPOFOL 1000 MG/100ML IV EMUL
INTRAVENOUS | Status: AC
  Filled 2023-03-04: qty 200

## 2023-03-04 NOTE — Anesthesia Preprocedure Evaluation (Signed)
Anesthesia Evaluation  Patient identified by MRN, date of birth, ID band Patient awake    Reviewed: Allergy & Precautions, NPO status , Patient's Chart, lab work & pertinent test results  History of Anesthesia Complications Negative for: history of anesthetic complications  Airway Mallampati: II  TM Distance: >3 FB Neck ROM: Full    Dental no notable dental hx. (+) Teeth Intact   Pulmonary neg pulmonary ROS, neg sleep apnea, neg COPD, Patient abstained from smoking.Not current smoker   Pulmonary exam normal breath sounds clear to auscultation       Cardiovascular Exercise Tolerance: Good METS(-) hypertension(-) CAD and (-) Past MI negative cardio ROS (-) dysrhythmias  Rhythm:Regular Rate:Normal - Systolic murmurs    Neuro/Psych negative neurological ROS  negative psych ROS   GI/Hepatic ,neg GERD  ,,(+)     (-) substance abuse    Endo/Other  diabetes  Patient on Ozempic GLP1, has not taken in 8 days. Denies GI symptoms today  Renal/GU negative Renal ROS     Musculoskeletal   Abdominal   Peds  Hematology   Anesthesia Other Findings Past Medical History: No date: Diabetes ([place])  Reproductive/Obstetrics                              Anesthesia Physical Anesthesia Plan  ASA: 2  Anesthesia Plan: General   Post-op Pain Management: Minimal or no pain anticipated   Induction: Intravenous  PONV Risk Score and Plan: 3 and Propofol infusion, TIVA and Ondansetron  Airway Management Planned: Nasal Cannula  Additional Equipment: None  Intra-op Plan:   Post-operative Plan:   Informed Consent: I have reviewed the patients History and Physical, chart, labs and discussed the procedure including the risks, benefits and alternatives for the proposed anesthesia with the patient or authorized representative who has indicated his/her understanding and acceptance.     Dental advisory  given  Plan Discussed with: CRNA and Surgeon  Anesthesia Plan Comments: (Discussed risks of anesthesia with patient, including possibility of difficulty with spontaneous ventilation under anesthesia necessitating airway intervention, PONV, and rare risks such as cardiac or respiratory or neurological events, and allergic reactions. Discussed the role of CRNA in patient's perioperative care. Patient understands.)         Anesthesia Quick Evaluation

## 2023-03-04 NOTE — Transfer of Care (Signed)
Immediate Anesthesia Transfer of Care Note  Patient: Kimberly Blair  Procedure(s) Performed: COLONOSCOPY WITH PROPOFOL  Patient Location: Endoscopy Unit  Anesthesia Type:General  Level of Consciousness: awake, alert , and oriented  Airway & Oxygen Therapy: Patient Spontanous Breathing  Post-op Assessment: Report given to RN and Post -op Vital signs reviewed and stable  Post vital signs: Reviewed and stable  Last Vitals:  Vitals Value Taken Time  BP 88/75 03/04/23 0820  Temp    Pulse 84 03/04/23 0820  Resp 19 03/04/23 0820  SpO2 96 % 03/04/23 0820  Vitals shown include unvalidated device data.  Last Pain:  Vitals:   03/04/23 0713  TempSrc: Temporal  PainSc: 0-No pain         Complications: No notable events documented.

## 2023-03-04 NOTE — Anesthesia Postprocedure Evaluation (Signed)
Anesthesia Post Note  Patient: Kimberly Blair  Procedure(s) Performed: COLONOSCOPY WITH PROPOFOL  Patient location during evaluation: Endoscopy Anesthesia Type: General Level of consciousness: awake and alert Pain management: pain level controlled Vital Signs Assessment: post-procedure vital signs reviewed and stable Respiratory status: spontaneous breathing, nonlabored ventilation, respiratory function stable and patient connected to nasal cannula oxygen Cardiovascular status: blood pressure returned to baseline and stable Postop Assessment: no apparent nausea or vomiting Anesthetic complications: no   No notable events documented.   Last Vitals:  Vitals:   03/04/23 0819 03/04/23 0839  BP: (!) 88/75   Pulse: 87   Resp: 19   Temp: (!) 36.4 C   SpO2: 95% 100%    Last Pain:  Vitals:   03/04/23 0839  TempSrc:   PainSc: 0-No pain                 Arita Miss

## 2023-03-04 NOTE — H&P (Signed)
  Cephas Darby, MD [address]  [place], [place] [address]  Main: [phone]  Fax: [phone] Pager: [phone]  Primary Care Physician:  Alvy Beal, NP Primary Gastroenterologist:  Dr. Cephas Darby  Pre-Procedure History & Physical: HPI:  Kimberly Blair is a 47 y.o. female is here for an colonoscopy.   Past Medical History:  Diagnosis Date   Diabetes [facility])     Past Surgical History:  Procedure Laterality Date   KNEE SURGERY Right     Prior to Admission medications   Medication Sig Start Date End Date Taking? Authorizing Provider  cephALEXin (KEFLEX) 500 MG capsule Take 1 capsule (500 mg total) by mouth 3 (three) times daily. 07/24/18  Yes Paulette Blanch, MD  glipiZIDE (GLUCOTROL) 5 MG tablet Take by mouth 2 (two) times daily before a meal.   Yes [provider]  insulin detemir (LEVEMIR) 100 UNIT/ML injection Inject into the skin as needed.   Yes [provider]  lisinopril (PRINIVIL,ZESTRIL) 10 MG tablet Take 10 mg by mouth daily.   Yes [provider]  metFORMIN (GLUCOPHAGE) 1000 MG tablet Take 1,000 mg by mouth 2 (two) times daily with a meal.   Yes [provider]  ondansetron (ZOFRAN ODT) 4 MG disintegrating tablet Take 1 tablet (4 mg total) by mouth every 8 (eight) hours as needed for nausea or vomiting. 07/24/18  Yes Paulette Blanch, MD    Allergies as of 12/29/2022 - Review Complete 12/29/2022  Allergen Reaction Noted   Codeine Hives 07/24/2018    No family history on file.  Social History   Socioeconomic History   Marital status: Married    Spouse name: Not on file   Number of children: Not on file   Years of education: Not on file   Highest education level: Not on file  Occupational History   Not on file  Tobacco Use   Smoking status: Never   Smokeless tobacco: Never  Substance and Sexual Activity   Alcohol use: Yes   Drug use: Never   Sexual activity: Not on file  Other Topics Concern   Not  on file  Social History Narrative   Not on file   Social Determinants of Health   Financial Resource Strain: Not on file  Food Insecurity: Not on file  Transportation Needs: Not on file  Physical Activity: Not on file  Stress: Not on file  Social Connections: Not on file  Intimate Partner Violence: Not on file    Review of Systems: See HPI, otherwise negative ROS  Physical Exam: BP 115/68   Pulse 79   Temp (!) 96.6 F (35.9 C) (Temporal)   Resp 20   Ht 5\' 3"  (1.6 m)   Wt 56 kg   SpO2 100%   BMI 21.86 kg/m  General:   Alert,  pleasant and cooperative in NAD Head:  Normocephalic and atraumatic. Neck:  Supple; no masses or thyromegaly. Lungs:  Clear throughout to auscultation.    Heart:  Regular rate and rhythm. Abdomen:  Soft, nontender and nondistended. Normal bowel sounds, without guarding, and without rebound.   Neurologic:  Alert and  oriented x4;  grossly normal neurologically.  Impression/Plan: Kimberly Blair is here for an colonoscopy to be performed for cologuard positive  Risks, benefits, limitations, and alternatives regarding  colonoscopy have been reviewed with the patient.  Questions have been answered.  All parties agreeable.   Sherri Sear, MD  03/04/2023, 7:47 AM

## 2023-03-04 NOTE — Op Note (Signed)
[facility] Gastroenterology Patient Name: Kimberly Blair Procedure Date: 03/04/2023 7:27 AM MRN: [id number] Account #: [id number] Date of Birth: 15-Oct-1976 Admit Type: Outpatient Age: 47 Room: [facility] ENDO ROOM 2 Gender: Female Note Status: Finalized Instrument Name: Peds Colonoscope [id number] Procedure:             Colonoscopy Indications:           This is the patient's first colonoscopy, Positive                         Cologuard test Providers:             Lin Landsman MD, MD Referring MD:          No Local Md, MD (Referring MD) Medicines:             General Anesthesia Complications:         No immediate complications. Estimated blood loss: None. Procedure:             Pre-Anesthesia Assessment:                        - Prior to the procedure, a History and Physical was                         performed, and patient medications and allergies were                         reviewed. The patient is competent. The risks and                         benefits of the procedure and the sedation options and                         risks were discussed with the patient. All questions                         were answered and informed consent was obtained.                         Patient identification and proposed procedure were                         verified by the physician, the nurse, the                         anesthesiologist, the anesthetist and the technician                         in the pre-procedure area in the procedure room in the                         endoscopy suite. Mental Status Examination: alert and                         oriented. Airway Examination: normal oropharyngeal                         airway and neck mobility. Respiratory Examination:  clear to auscultation. CV Examination: normal.                         Prophylactic Antibiotics: The patient does not require                         prophylactic antibiotics. Prior  Anticoagulants: The                         patient has taken no anticoagulant or antiplatelet                         agents. ASA Grade Assessment: II - A patient with mild                         systemic disease. After reviewing the risks and                         benefits, the patient was deemed in satisfactory                         condition to undergo the procedure. The anesthesia                         plan was to use general anesthesia. Immediately prior                         to administration of medications, the patient was                         re-assessed for adequacy to receive sedatives. The                         heart rate, respiratory rate, oxygen saturations,                         blood pressure, adequacy of pulmonary ventilation, and                         response to care were monitored throughout the                         procedure. The physical status of the patient was                         re-assessed after the procedure.                        After obtaining informed consent, the colonoscope was                         passed under direct vision. Throughout the procedure,                         the patient's blood pressure, pulse, and oxygen                         saturations were monitored continuously. The  Colonoscope was introduced through the anus and                         advanced to the the cecum, identified by appendiceal                         orifice and ileocecal valve. The colonoscopy was                         performed without difficulty. The patient tolerated                         the procedure well. The quality of the bowel                         preparation was evaluated using the BBPS [facility] Bowel                         Preparation Scale) with scores of: Right Colon = 3,                         Transverse Colon = 3 and Left Colon = 3 (entire mucosa                         seen well with no residual  staining, small fragments                         of stool or opaque liquid). The total BBPS score                         equals 9. The ileocecal valve, appendiceal orifice,                         and rectum were photographed. Findings:      The perianal and digital rectal examinations were normal. Pertinent       negatives include normal sphincter tone and no palpable rectal lesions.      The entire examined colon appeared normal.      The retroflexed view of the distal rectum and anal verge was normal and       showed no anal or rectal abnormalities. Impression:            - The entire examined colon is normal.                        - The distal rectum and anal verge are normal on                         retroflexion view.                        - No specimens collected. Recommendation:        - Discharge patient to home (with escort).                        - Resume previous diet today.                        -  Continue present medications.                        - Repeat colonoscopy in 10 years for screening                         purposes. Procedure Code(s):     --- Professional ---                        [phone], Colonoscopy, flexible; diagnostic, including                         collection of specimen(s) by brushing or washing, when                         performed (separate procedure) Diagnosis Code(s):     --- Professional ---                        R19.5, Other fecal abnormalities CPT copyright 2022 [employer]. All rights reserved. The codes documented in this report are preliminary and upon coder review may  be revised to meet current compliance requirements. Dr. Ulyess Mort Lin Landsman MD, MD 03/04/2023 8:17:53 AM This report has been signed electronically. Number of Addenda: 0 Note Initiated On: 03/04/2023 7:27 AM Scope Withdrawal Time: 0 hours 11 minutes 59 seconds  Total Procedure Duration: 0 hours 15 minutes 30 seconds  Estimated Blood  Loss:  Estimated blood loss: none.      [facility]

## 2023-03-04 NOTE — Anesthesia Procedure Notes (Signed)
Date/Time: 03/04/2023 7:57 AM  Performed by: Lily Peer, Khalib Fendley, CRNAPre-anesthesia Checklist: Patient identified, Emergency Drugs available, Suction available, Patient being monitored and Timeout performed Patient Re-evaluated:Patient Re-evaluated prior to induction Oxygen Delivery Method: Nasal cannula Induction Type: IV induction

## 2023-03-07 ENCOUNTER — Encounter: Payer: Self-pay | Admitting: Gastroenterology

## 2023-07-26 ENCOUNTER — Other Ambulatory Visit: Payer: Self-pay | Admitting: Family Medicine

## 2023-07-26 DIAGNOSIS — Z1231 Encounter for screening mammogram for malignant neoplasm of breast: Secondary | ICD-10-CM

## 2023-07-27 ENCOUNTER — Ambulatory Visit
Admission: RE | Admit: 2023-07-27 | Discharge: 2023-07-27 | Disposition: A | Discharge status: Home / Self Care | Payer: BC Managed Care – PPO | Source: Ambulatory Visit | Attending: Family Medicine | Admitting: Family Medicine

## 2023-07-27 DIAGNOSIS — Z1231 Encounter for screening mammogram for malignant neoplasm of breast: Secondary | ICD-10-CM | POA: Insufficient documentation
